# Patient Record
Sex: Male | Born: 1990
Health system: Southern US, Community
[De-identification: ages and names within clinical notes are randomized; demographics above are authoritative.]

## PROBLEM LIST (undated history)

## (undated) ENCOUNTER — Ambulatory Visit (HOSPITAL_COMMUNITY): Discharge status: Absent without leave | Payer: No Typology Code available for payment source

## (undated) DIAGNOSIS — F419 Anxiety disorder, unspecified: Secondary | ICD-10-CM

## (undated) HISTORY — PX: KNEE SURGERY: SHX244

## (undated) HISTORY — DX: Anxiety disorder, unspecified: F41.9

---

## 2013-08-13 ENCOUNTER — Ambulatory Visit (INDEPENDENT_AMBULATORY_CARE_PROVIDER_SITE_OTHER): Payer: BC Managed Care – PPO | Admitting: Physician Assistant

## 2013-08-13 VITALS — BP 136/80 | HR 81 | Temp 98.9°F | Ht 70.0 in | Wt 175.0 lb

## 2013-08-13 DIAGNOSIS — A749 Chlamydial infection, unspecified: Secondary | ICD-10-CM

## 2013-08-13 DIAGNOSIS — A7489 Other chlamydial diseases: Secondary | ICD-10-CM

## 2013-08-13 MED ORDER — AZITHROMYCIN 500 MG PO TABS
1000.0000 mg | ORAL_TABLET | Freq: Once | ORAL | Status: DC
Start: 2013-08-13 — End: 2019-02-05

## 2013-08-13 NOTE — Progress Notes (Signed)
   Subjective:    Patient ID: Miguel Lee, male    DOB: October 06, 1990, 23 y.o.   MRN: [id number]  HPI Pt's girlfriend had chlamydia and he had his testing done at [facility] and they called him and told him he also had chlamydia.  They also tested him for HIV, gonorrhea and syphilis.  He is not having any symptoms.    Review of Systems     Objective:   Physical Exam  Vitals reviewed. Constitutional: He is oriented to person, place, and time. He appears well-developed and well-nourished.  HENT:  Head: Normocephalic and atraumatic.  Right Ear: External ear normal.  Left Ear: External ear normal.  Eyes: Conjunctivae are normal.  Neck: Normal range of motion.  Pulmonary/Chest: Effort normal.  Neurological: He is alert and oriented to person, place, and time.  Skin: Skin is warm and dry.  Psychiatric: He has a normal mood and affect. His behavior is normal. Judgment and thought content normal.       Assessment & Plan:  Chlamydia - Plan: azithromycin (ZITHROMAX) 500 MG tablet  Recommended no sexual encounter for 7 days - and also encouraged test for cure in 7-10 days.  Benny LennertSarah Weber PA-C 08/13/2013 7:02 PM

## 2014-05-10 ENCOUNTER — Emergency Department (HOSPITAL_COMMUNITY)
Admission: EM | Admit: 2014-05-10 | Discharge: 2014-05-10 | Disposition: A | Discharge status: Home / Self Care | Payer: No Typology Code available for payment source | Attending: Emergency Medicine | Admitting: Emergency Medicine

## 2014-05-10 ENCOUNTER — Encounter (HOSPITAL_COMMUNITY): Payer: Self-pay | Admitting: Emergency Medicine

## 2014-05-10 DIAGNOSIS — Z87891 Personal history of nicotine dependence: Secondary | ICD-10-CM | POA: Diagnosis not present

## 2014-05-10 DIAGNOSIS — S3992XA Unspecified injury of lower back, initial encounter: Secondary | ICD-10-CM | POA: Diagnosis not present

## 2014-05-10 DIAGNOSIS — Y92410 Unspecified street and highway as the place of occurrence of the external cause: Secondary | ICD-10-CM | POA: Insufficient documentation

## 2014-05-10 DIAGNOSIS — Y9389 Activity, other specified: Secondary | ICD-10-CM | POA: Insufficient documentation

## 2014-05-10 DIAGNOSIS — G44219 Episodic tension-type headache, not intractable: Secondary | ICD-10-CM | POA: Insufficient documentation

## 2014-05-10 DIAGNOSIS — M545 Low back pain, unspecified: Secondary | ICD-10-CM

## 2014-05-10 DIAGNOSIS — S50812A Abrasion of left forearm, initial encounter: Secondary | ICD-10-CM | POA: Insufficient documentation

## 2014-05-10 DIAGNOSIS — S40811A Abrasion of right upper arm, initial encounter: Secondary | ICD-10-CM | POA: Insufficient documentation

## 2014-05-10 MED ORDER — METHOCARBAMOL 500 MG PO TABS: 500.0000 mg | ORAL_TABLET | Freq: Two times a day (BID) | ORAL | Status: DC

## 2014-05-10 MED ORDER — IBUPROFEN 400 MG PO TABS
800.0000 mg | ORAL_TABLET | Freq: Once | ORAL | Status: AC
  Administered 2014-05-10: 800 mg via ORAL
  Filled 2014-05-10: qty 2

## 2014-05-10 MED ORDER — NAPROXEN 500 MG PO TABS: 500.0000 mg | ORAL_TABLET | Freq: Two times a day (BID) | ORAL | Status: DC

## 2014-05-10 NOTE — ED Provider Notes (Signed)
Medical screening examination/treatment/procedure(s) were performed by non-physician practitioner and as supervising physician I was immediately available for consultation/collaboration.   EKG Interpretation None        Revis Whalin N Megan Hayduk, DO 05/10/14 [id number] 

## 2014-05-10 NOTE — ED Notes (Signed)
Declined W/C at D/C and was escorted to lobby by RN. 

## 2014-05-10 NOTE — Discharge Instructions (Signed)
1. Medications: robaxin, naproxyn, usual home medications °2. Treatment: rest, drink plenty of fluids, gentle stretching as discussed, alternate ice and heat °3. Follow Up: Please followup with your primary doctor in 3 days for discussion of your diagnoses and further evaluation after today's visit; if you do not have a primary care doctor use the resource guide provided to find one;  Return to the ER for worsening back pain, difficulty walking, loss of bowel or bladder control or other concerning symptoms ° ° °Back Exercises °Back exercises help treat and prevent back injuries. The goal of back exercises is to increase the strength of your abdominal and back muscles and the flexibility of your back. These exercises should be started when you no longer have back pain. Back exercises include: °· Pelvic Tilt. Lie on your back with your knees bent. Tilt your pelvis until the lower part of your back is against the floor. Hold this position 5 to 10 sec and repeat 5 to 10 times. °· Knee to Chest. Pull first 1 knee up against your chest and hold for 20 to 30 seconds, repeat this with the other knee, and then both knees. This may be done with the other leg straight or bent, whichever feels better. °· Sit-Ups or Curl-Ups. Bend your knees 90 degrees. Start with tilting your pelvis, and do a partial, slow sit-up, lifting your trunk only 30 to 45 degrees off the floor. Take at least 2 to 3 seconds for each sit-up. Do not do sit-ups with your knees out straight. If partial sit-ups are difficult, simply do the above but with only tightening your abdominal muscles and holding it as directed. °· Hip-Lift. Lie on your back with your knees flexed 90 degrees. Push down with your feet and shoulders as you raise your hips a couple inches off the floor; hold for 10 seconds, repeat 5 to 10 times. °· Back arches. Lie on your stomach, propping yourself up on bent elbows. Slowly press on your hands, causing an arch in your low back. Repeat 3  to 5 times. Any initial stiffness and discomfort should lessen with repetition over time. °· Shoulder-Lifts. Lie face down with arms beside your body. Keep hips and torso pressed to floor as you slowly lift your head and shoulders off the floor. °Do not overdo your exercises, especially in the beginning. Exercises may cause you some mild back discomfort which lasts for a few minutes; however, if the pain is more severe, or lasts for more than 15 minutes, do not continue exercises until you see your caregiver. Improvement with exercise therapy for back problems is slow.  °See your caregivers for assistance with developing a proper back exercise program. °Document Released: 08/04/2004 Document Revised: 09/19/2011 Document Reviewed: 04/28/2011 °ExitCare® Patient Information ©2015 [employer]. This information is not intended to replace advice given to you by your health care provider. Make sure you discuss any questions you have with your health care provider. ° ° ° °Emergency Department Resource Guide °1) Find a Doctor and Pay Out of Pocket °Although you won't have to find out who is covered by your insurance plan, it is a good idea to ask around and get recommendations. You will then need to call the office and see if the doctor you have chosen will accept you as a new patient and what types of options they offer for patients who are self-pay. Some doctors offer discounts or will set up payment plans for their patients who do not have insurance, but   you will need to ask so you aren't surprised when you get to your appointment. ° °2) Contact Your Local Health Department °Not all health departments have doctors that can see patients for sick visits, but many do, so it is worth a call to see if yours does. If you don't know where your local health department is, you can check in your phone book. The CDC also has a tool to help you locate your state's health department, and many state websites also have listings of all  of their local health departments. ° °3) Find a Walk-in Clinic °If your illness is not likely to be very severe or complicated, you may want to try a walk in clinic. These are popping up all over the country in pharmacies, drugstores, and shopping centers. They're usually staffed by nurse practitioners or physician assistants that have been trained to treat common illnesses and complaints. They're usually fairly quick and inexpensive. However, if you have serious medical issues or chronic medical problems, these are probably not your best option. ° °No Primary Care Doctor: °- Call [facility] at  832-8000 - they can help you locate a primary care doctor that  accepts your insurance, provides certain services, etc. °- Physician Referral Service- 1-800-533-3463 ° °Chronic Pain Problems: °Organization         Address  Phone   Notes  °[facility]  (336) 297-2271 Patients need to be referred by their primary care doctor.  ° °Medication Assistance: °Organization         Address  Phone   Notes  °[place] [facility] [address] °[place], [place] [address] (336) 641-8030 --Must be a resident of [place] °-- Must have NO insurance coverage whatsoever (no Medicaid/ Medicare, etc.) °-- The pt. MUST have a primary care doctor that directs their care regularly and follows them in the community °  °[facility]  (866) 331-1348   °[employer]  (888) 892-1162   ° °Agencies that provide inexpensive medical care: °Organization         Address  Phone   Notes  °[facility]  (336) 832-8035   °[facility]    (336) 832-7272   °[facility] [address] °[place], [place] [address] (336) 832-4777   °[facility] [address], °[place] (336) 271-4999   °[facility]    (336) 373-0678   °[facility]    (336) 272-1050   °[facility] ° [address],  [place] Phone:  (336) 832-4444, Fax:  (336) 832-4440 Hours of Operation:  9 am - 6 pm, M-F.  Also accepts Medicaid/Medicare and self-pay.  °[facility] ° [address], [place] Phone: (336) 832-3150, Fax: (336) 832-3151. Hours of Operation:  8:30 am - 5:30 pm, M-F.  Also accepts Medicaid and self-pay.  °[facility] [place] [address], [place] Phone: (336) 878-6027   °[facility] [address], [place],  (336)723-1848, Ext. 123 Mondays & Thursdays: 7-9 AM.  First 15 patients are seen on a first come, first serve basis. °  ° °Medicaid-accepting [place] Providers: ° °Organization         Address  Phone   Notes  °[facility] [address], [place] (336) 641-2100 Also accepts self-pay patients.  °[facility] [address], [place] ° (336) 856-9996   °[facility] [address], [place] (336) 288-8857   °[facility] [address] [place] [address], [place] (336) 299-7000   °Veita Bland [address], [place]  ° (336) 373-1557 Only accepts [place] Access Medicaid patients after they have their name applied to their card.  ° °Self-Pay (no insurance) in [place]: ° °Organization         Address  Phone   Notes  °Sickle Cell Patients, [facility] [address], [place] (336) 832-1970   °[facility] [address], [place] (336) 832-4400   °[facility] [employer] ° [address], [place] (336) 992-4800   °[facility]/Dr. Osei-Bonsu ° [address] [place] [address], [place] or [address], [place] (336) 841-8500 Phone number for both [place] and [place] locations is the same.  °[facility] [address], [place] (336) 299-0000   °[facility] [address] [place] [address], [place] or [address] (336) 852-7530 °(336) 878-2260   °[facility] [address], [place] (336) 350-1642, phone; (336) 294-5005, fax Sees patients 1st and 3rd Saturday of every month.  Must not qualify for public or private insurance (i.e. Medicaid, Medicare, [place] Health Choice, Veterans' Benefits) • Household income should be no more than 200% of the poverty level •The clinic cannot treat you if you are pregnant or think you are pregnant • Sexually transmitted diseases are not treated at the clinic.  ° ° °Dental Care: °Organization         Address  Phone  Notes  °[facility] [address], [place] (336) 641-6152 Accepts children up to age 21 who are enrolled in Medicaid or [place] Health Choice; pregnant women with a Medicaid card; and children who have applied for Medicaid or [place] Health Choice, but were declined, whose parents can pay a reduced fee at time of service.  °[facility] [place]  [address], [place] (336) 641-7733 Accepts children up to age 21 who are enrolled in Medicaid or [place] Health Choice; pregnant women with a Medicaid card; and children who have applied for Medicaid or [place] Health Choice, but were declined, whose parents can pay a reduced fee at time of service.  °[facility] ° [address], [place] (336) 641-4533 Patients are seen by appointment only. Walk-ins are not accepted. [facility] will see patients 18 years of age and older. °Monday - Tuesday (8am-5pm) °Most Wednesdays (8:30-5pm) °$30 per visit, cash only  °[facility] ° [address], [place] (336) 641-4533 Patients are seen by appointment only. Walk-ins are not accepted. [facility] will see patients 18 years of age and older. °One Wednesday Evening (Monthly: Volunteer Based).  $30 per visit, cash only  °[facility]   (919) 537-3737 for adults; Children under age 4, call Graduate Pediatric Dentistry at ([phone]) [phone]. Children aged 4-14, please call (919) [phone] to request a pediatric application. ° Dental services are provided in all areas of dental care including fillings, crowns and bridges, complete and partial dentures, implants, gum treatment, root canals, and extractions. Preventive care is also provided. Treatment is provided to both adults and children. °Patients are selected via a lottery   and there is often a waiting list. °  °[facility] [address], °[place] ° (336) 763-8833 [email or web address] °  °[facility] [address], [place], [place] (336)723-1848, Ext. 123 Second and Fourth Thursday of each month, opens at 6:30 AM; Clinic ends at 9 AM.  Patients are seen on a first-come first-served basis, and a limited number are seen during each clinic.  ° °[facility] ° [address], [place], [place] (336) 723-7904   Eligibility Requirements °You must have lived in [place], [place], or [place] counties for at least the last three months. °  You cannot be eligible for state or federal sponsored healthcare insurance, including Veterans Administration, Medicaid, or Medicare. °  You generally cannot be eligible for healthcare insurance through your employer.  °  How to apply: °Eligibility screenings are held every Tuesday and Wednesday afternoon from 1:00 pm until 4:00 pm. You do not need an appointment for the interview!  °[facility] [address], [place], [place] 336-631-2330   °[facility]  336-342-8273   °[facility]  336-703-3100   °[facility]  336-570-6415   ° °Behavioral Health Resources in the Community: °Intensive Outpatient Programs °Organization         Address  Phone  Notes  °[facility] [address], [place], [place] 336-878-6098   °[facility] [address], [place], [place] 336-832-9800   °[facility] [address], [place], [place] ° 336-882-2125   °[facility] [address],  °[place], [place] 1-800-853-5163 or 336-641-4981   °Substance Abuse Resources °Organization         Address  Phone  Notes  °[facility]  336-882-2125   °[facility]  336-784-9470   °[facility]  336-285-9073   °[facility]  336-845-3988   °Residential & Outpatient Substance Abuse Program  1-800-659-3381   °Psychological Services °Organization         Address  Phone  Notes  °[facility]  336- 832-9600   °[facility]  336- 378-7881   °[facility] [address], [place] 1-800-853-5163 or 336-641-4981   ° °Mobile Crisis Teams °Organization         Address  Phone  Notes  °[facility], Mobile Crisis Care Unit  1-877-626-1772   °Assertive °[facility] ° [address] [place], [place] 336-834-9664   °Sharon DeEsch [address] °[place] 336-554-5454   ° °Self-Help/Support Groups °Organization         Address  Phone             Notes  °[facility] [place] - variety of support groups  336- 373-1402 Call for more information  °Narcotics Anonymous (NA), [facility] [address], °[place]  2 meetings at this location  ° °Residential Treatment Programs °Organization         Address  Phone  Notes  °[facility] [address],    °[place]  1-866-801-8205   °[facility] ° [address], [place], [place] 704-293-8524   °[facility] [address], [place] 336-845-3988 Admissions: 8am-3pm M-F  °[facility] [address]    °[place], [place] 336-841-1104   °[facility] [address], , [place]   336-379-7146   °[facility] [address]  °[place], [place] 336-285-9073     °[facility] - Intensive Outpatient [address], [place], [place] 336-852-3033   °[facility] [address]  °[place], [place] 1-877-615-2722 or 336-784-9470   °[facility] [address] [place], [place] 336-227-7417 Accepts Medicaid  °[facility] [address]  °[place] 1-800-659-3381 Substance Abuse/Addiction Treatment  ° °[place] Behavioral Health Resources °Organization         Address  Phone  Notes  °[facility]  (888) 581-9988   °Julie Brannon, PhD [address] [place], [place]   (336) 349-5553 or (336) 951-0000   °[facility]   [address] °[place], [place] (336) 349-4454   °[facility] [address], [place], [place] (336) 342-8316 Insurance/Medicaid/sponsorship through [facility]  °[facility] [address]                                    [place], [place] (336) 342-8316 Therapy/tele-psych/case  °[facility] [address]  ° [place], [place] (336) 349-2233    °Dr. Arfeen  (336) 349-4544   °[facility] [place]  [employer] [place] [facility] 1) [address], [place] °2) [address], [place] °3)  [address], [place] (336) 349-3220 °(336) 342-7768 ° °(336) 342-8140   °[place] [facility] (336) 342-1394 or (336) 342-3537 (After Hours)    ° ° ° ° ° °
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
## 2016-05-17 ENCOUNTER — Ambulatory Visit (HOSPITAL_COMMUNITY)
Admission: EM | Admit: 2016-05-17 | Discharge: 2016-05-17 | Disposition: A | Discharge status: Home / Self Care | Payer: Self-pay | Attending: Family Medicine | Admitting: Family Medicine

## 2016-05-17 DIAGNOSIS — K047 Periapical abscess without sinus: Secondary | ICD-10-CM

## 2016-05-17 MED ORDER — DICLOFENAC POTASSIUM 50 MG PO TABS: 50.0000 mg | ORAL_TABLET | Freq: Three times a day (TID) | ORAL | 1 refills | Status: DC

## 2016-05-17 MED ORDER — CLINDAMYCIN HCL 300 MG PO CAPS: 300.0000 mg | ORAL_CAPSULE | Freq: Three times a day (TID) | ORAL | 0 refills | Status: DC

## 2016-05-17 NOTE — ED Triage Notes (Signed)
C/o dental pain States two days ago tooth has been hurting  Did break tooth initially two years ago

## 2016-05-17 NOTE — Discharge Instructions (Signed)
Take medicine as prescribed, see your dentist as soon as possible °

## 2016-05-17 NOTE — ED Provider Notes (Signed)
[facility]    CSN: [id number] Arrival date & time: 05/17/16  1005     History   Chief Complaint Chief Complaint  Patient presents with  . Dental Pain    right bottom tooth     HPI Miguel Lee is a 25 y.o. male.   The history is provided by the patient.  Dental Pain  Location:  Lower Lower teeth location:  31/RL 2nd molar Quality:  Throbbing Severity:  Moderate Onset quality:  Sudden Duration:  2 days Progression:  Worsening Chronicity:  New Context: dental fracture   Relieved by:  Topical anesthetic gel Worsened by:  Nothing Ineffective treatments:  None tried Associated symptoms: facial swelling and gum swelling   Risk factors: lack of dental care     No past medical history on file.  There are no active problems to display for this patient.   Past Surgical History:  Procedure Laterality Date  . KNEE SURGERY         Home Medications    Prior to Admission medications   Medication Sig Start Date End Date Taking? Authorizing Provider  azithromycin (ZITHROMAX) 500 MG tablet Take 2 tablets (1,000 mg total) by mouth once. 08/13/13   Morrell RiddleSarah L Weber, PA-C  methocarbamol (ROBAXIN) 500 MG tablet Take 1 tablet (500 mg total) by mouth 2 (two) times daily. 05/10/14   Hannah Muthersbaugh, PA-C  naproxen (NAPROSYN) 500 MG tablet Take 1 tablet (500 mg total) by mouth 2 (two) times daily with a meal. 05/10/14   Dahlia ClientHannah Muthersbaugh, PA-C    Family History No family history on file.  Social History Social History  Substance Use Topics  . Smoking status: Former Games developermoker  . Smokeless tobacco: Never Used  . Alcohol use Yes     Allergies   Patient has no known allergies.   Review of Systems Review of Systems  Constitutional: Negative.   HENT: Positive for dental problem and facial swelling.   All other systems reviewed and are negative.    Physical Exam Triage Vital Signs ED Triage Vitals [05/17/16 1030]  Enc Vitals Group     BP 135/87   Pulse Rate 80     Resp 16     Temp 98.2 F (36.8 C)     Temp Source Oral     SpO2 98 %     Weight      Height      Head Circumference      Peak Flow      Pain Score      Pain Loc      Pain Edu?      Excl. in GC?    No data found.   Updated Vital Signs BP 135/87 (BP Location: Left Arm)   Pulse 80   Temp 98.2 F (36.8 C) (Oral)   Resp 16   SpO2 98%   Visual Acuity Right Eye Distance:   Left Eye Distance:   Bilateral Distance:    Right Eye Near:   Left Eye Near:    Bilateral Near:     Physical Exam  Constitutional: He appears well-developed and well-nourished.  HENT:  Head: Normocephalic.  Mouth/Throat: Mucous membranes are normal. Abnormal dentition. Dental abscesses and dental caries present.       UC Treatments / Results  Labs (all labs ordered are listed, but only abnormal results are displayed) Labs Reviewed - No data to display  EKG  EKG Interpretation None       Radiology No results  found.  Procedures Procedures (including critical care time)  Medications Ordered in UC Medications - No data to display   Initial Impression / Assessment and Plan / UC Course  I have reviewed the triage vital signs and the nursing notes.  Pertinent labs & imaging results that were available during my care of the patient were reviewed by me and considered in my medical decision making (see chart for details).  Clinical Course       Final Clinical Impressions(s) / UC Diagnoses   Final diagnoses:  None    New Prescriptions New Prescriptions   No medications on file     Linna HoffJames D Cameo Shewell, MD 05/17/16 1049

## 2016-05-18 MED FILL — CLINDAMYCIN HCL 150 MG CAP: 150 | 10 days supply | Qty: 30 | Fill #0

## 2016-09-05 MED FILL — AMOXICILLIN 500 MG CAPSULE: 500 | 7 days supply | Qty: 30 | Fill #0

## 2016-09-05 MED FILL — HYDROCODON-APAP 5-325: 5-325 | 2 days supply | Qty: 6 | Fill #0

## 2017-10-16 ENCOUNTER — Other Ambulatory Visit: Payer: Self-pay | Admitting: Nurse Practitioner

## 2017-10-16 ENCOUNTER — Ambulatory Visit
Admission: RE | Admit: 2017-10-16 | Discharge: 2017-10-16 | Disposition: A | Discharge status: Home / Self Care | Payer: Managed Care, Other (non HMO) | Source: Ambulatory Visit | Attending: Nurse Practitioner | Admitting: Nurse Practitioner

## 2017-10-16 DIAGNOSIS — M545 Low back pain: Secondary | ICD-10-CM

## 2018-01-04 ENCOUNTER — Other Ambulatory Visit: Payer: Self-pay | Admitting: Neurosurgery

## 2018-01-04 DIAGNOSIS — M4317 Spondylolisthesis, lumbosacral region: Secondary | ICD-10-CM

## 2018-01-20 ENCOUNTER — Ambulatory Visit
Admission: RE | Admit: 2018-01-20 | Discharge: 2018-01-20 | Disposition: A | Discharge status: Home / Self Care | Payer: Managed Care, Other (non HMO) | Source: Ambulatory Visit | Attending: Neurosurgery | Admitting: Neurosurgery

## 2018-01-20 DIAGNOSIS — M4317 Spondylolisthesis, lumbosacral region: Secondary | ICD-10-CM

## 2018-12-24 ENCOUNTER — Ambulatory Visit: Payer: Self-pay | Admitting: Nurse Practitioner

## 2019-02-05 ENCOUNTER — Other Ambulatory Visit: Payer: Self-pay

## 2019-02-05 ENCOUNTER — Ambulatory Visit (INDEPENDENT_AMBULATORY_CARE_PROVIDER_SITE_OTHER): Payer: Managed Care, Other (non HMO) | Admitting: Nurse Practitioner

## 2019-02-05 ENCOUNTER — Encounter: Payer: Self-pay | Admitting: Nurse Practitioner

## 2019-02-05 VITALS — BP 120/72 | HR 102 | Temp 98.4°F | Ht 68.2 in | Wt 248.4 lb

## 2019-02-05 DIAGNOSIS — R000 Tachycardia, unspecified: Secondary | ICD-10-CM

## 2019-02-05 DIAGNOSIS — F329 Major depressive disorder, single episode, unspecified: Secondary | ICD-10-CM | POA: Diagnosis not present

## 2019-02-05 DIAGNOSIS — Z1211 Encounter for screening for malignant neoplasm of colon: Secondary | ICD-10-CM

## 2019-02-05 DIAGNOSIS — G8929 Other chronic pain: Secondary | ICD-10-CM | POA: Diagnosis not present

## 2019-02-05 DIAGNOSIS — F419 Anxiety disorder, unspecified: Secondary | ICD-10-CM | POA: Diagnosis not present

## 2019-02-05 DIAGNOSIS — M5442 Lumbago with sciatica, left side: Secondary | ICD-10-CM

## 2019-02-05 DIAGNOSIS — Z0000 Encounter for general adult medical examination without abnormal findings: Secondary | ICD-10-CM | POA: Diagnosis not present

## 2019-02-05 DIAGNOSIS — F32A Depression, unspecified: Secondary | ICD-10-CM | POA: Insufficient documentation

## 2019-02-05 LAB — POCT URINALYSIS DIPSTICK
Bilirubin, UA: NEGATIVE
Blood, UA: NEGATIVE
Glucose, UA: NEGATIVE
Ketones, UA: NEGATIVE
Nitrite, UA: NEGATIVE
Protein, UA: NEGATIVE
Spec Grav, UA: 1.025 (ref 1.010–1.025)
Urobilinogen, UA: 2 E.U./dL — AB
pH, UA: 7.5 (ref 5.0–8.0)

## 2019-02-05 MED ORDER — TRIAMCINOLONE ACETONIDE 40 MG/ML IJ SUSP
40.0000 mg | Freq: Once | INTRAMUSCULAR | Status: AC
  Administered 2019-02-05: 40 mg via INTRAMUSCULAR

## 2019-02-05 NOTE — Progress Notes (Signed)
Subjective:     Patient ID: Miguel Lee , male    DOB: 13-Nov-1990 , 28 y.o.   MRN: [id number]   Chief Complaint  Patient presents with  . Annual Exam   Men's preventive visit. Patient Health Questionnaire (PHQ-2) is    Office Visit from 02/05/2019 in [facility]  PHQ-2 Total Score  0     Patient is on a  diet. Marital status: Single. Relevant history for alcohol use is:  Social History   Substance and Sexual Activity  Alcohol Use Yes   Relevant history for tobacco use is:  Social History   Tobacco Use  Smoking Status Never Smoker  Smokeless Tobacco Never Used   HPI  Here for HM  [facility] - currently taking paxil.  Wt Readings from Last 3 Encounters: 02/05/19 : 248 lb 6.4 oz (112.7 kg) 05/10/14 : 187 lb (84.8 kg) 08/13/13 : 175 lb (79.4 kg)  Works with shipping.   Depression        This is a chronic problem.  The current episode started 1 to 4 weeks ago.   The problem has been gradually improving since onset.  Associated symptoms include no decreased concentration.  Past treatments include SSRIs - Selective serotonin reuptake inhibitors.  Compliance with treatment is good.  Past medical history includes anxiety.   Anxiety Presents for follow-up visit. Patient reports no chest pain, decreased concentration or palpitations.       Past Medical History:  Diagnosis Date  . Anxiety      No family history on file.   Current Outpatient Medications:  .  PARoxetine (PAXIL) 30 MG tablet, Take 30 mg by mouth daily., Disp: , Rfl:    No Known Allergies   Review of Systems  Constitutional: Negative.   HENT: Negative.   Eyes: Negative.   Respiratory: Negative.  Negative for cough.   Cardiovascular: Negative for chest pain, palpitations and leg swelling.  Gastrointestinal: Negative.   Endocrine: Negative.  Negative for polydipsia, polyphagia and polyuria.  Genitourinary: Negative.   Musculoskeletal: Positive for back pain.  Negative for gait problem and joint swelling.  Skin: Negative.   Allergic/Immunologic: Negative.   Neurological: Negative.   Hematological: Negative.   Psychiatric/Behavioral: Positive for depression. Negative for decreased concentration.     Today's Vitals   02/05/19 1536  BP: 120/72  Pulse: (!) 102  Temp: 98.4 F (36.9 C)  TempSrc: Oral  Weight: 248 lb 6.4 oz (112.7 kg)  Height: 5' 8.2" (1.732 m)  PainSc: 2   PainLoc: Back   Body mass index is 37.55 kg/m.   Objective:  Physical Exam Vitals signs reviewed.  Constitutional:      Appearance: Normal appearance. He is obese.  HENT:     Head: Normocephalic and atraumatic.     Right Ear: Tympanic membrane, ear canal and external ear normal. There is no impacted cerumen.     Left Ear: Tympanic membrane, ear canal and external ear normal. There is no impacted cerumen.  Neck:     Musculoskeletal: Normal range of motion and neck supple.  Cardiovascular:     Rate and Rhythm: Normal rate and regular rhythm.     Pulses: Normal pulses.     Heart sounds: Normal heart sounds. No murmur.  Pulmonary:     Effort: Pulmonary effort is normal. No respiratory distress.     Breath sounds: Normal breath sounds.  Abdominal:     General: Abdomen is flat. Bowel sounds are normal.  There is no distension.     Palpations: Abdomen is soft.  Genitourinary:    Prostate: Normal.     Rectum: Guaiac result negative.  Musculoskeletal: Normal range of motion.  Skin:    General: Skin is warm and dry.     Capillary Refill: Capillary refill takes less than 2 seconds.  Neurological:     General: No focal deficit present.     Mental Status: He is alert and oriented to person, place, and time.  Psychiatric:        Mood and Affect: Mood normal.        Behavior: Behavior normal.        Thought Content: Thought content normal.        Judgment: Judgment normal.         Assessment And Plan:     1. Encounter for general adult medical examination w/o  abnormal findings . Behavior modifications discussed and diet history reviewed.   . Pt will continue to exercise regularly and modify diet with low GI, plant based foods and decrease intake of processed foods.  . Recommend intake of daily multivitamin, Vitamin D, and calcium.  . Recommend for preventive screenings, as well as recommend immunizations that include TDAP . Discussed focusing on healthy diet and weight loss - POCT Urinalysis Dipstick (81002)   2. Chronic bilateral low back pain with left-sided sciatica  Will treat with kenalog  He has been to see Dr. Franky Machoabbell however unable to go back at this time, was told he has stenosis  Pain creams have been ineffective - triamcinolone acetonide (KENALOG-40) injection 40 mg  3. Anxiety  Being followed by Behavioral health every 3 months.  His psychiatrist had placed him on an antihypertensive a few months ago for elevated blood pressure however this resolved on its own and his blood pressure is normal  4. Depression, unspecified depression type  Being followed by Behavioral health every 3 months  5. Tachycardia  Heart rate is slightly elevated this visit, EKG revealed NSR - EKG 12-Lead   Arnette FeltsJanece Green Quin[place], FNP    THE PATIENT IS ENCOURAGED TO PRACTICE SOCIAL DISTANCING DUE TO THE COVID-19 PANDEMIC.

## 2019-02-05 NOTE — Patient Instructions (Addendum)
Health Maintenance  Topic Date Due  . HIV Screening  11/30/2004  . TETANUS/TDAP  11/30/2008  . INFLUENZA VACCINE  02/09/2019   Health Maintenance, Male Adopting a healthy lifestyle and getting preventive care are important in promoting health and wellness. Ask your health care provider about:  The right schedule for you to have regular tests and exams.  Things you can do on your own to prevent diseases and keep yourself healthy. What should I know about diet, weight, and exercise? Eat a healthy diet   Eat a diet that includes plenty of vegetables, fruits, low-fat dairy products, and lean protein.  Do not eat a lot of foods that are high in solid fats, added sugars, or sodium. Maintain a healthy weight Body mass index (BMI) is a measurement that can be used to identify possible weight problems. It estimates body fat based on height and weight. Your health care provider can help determine your BMI and help you achieve or maintain a healthy weight. Get regular exercise Get regular exercise. This is one of the most important things you can do for your health. Most adults should:  Exercise for at least 150 minutes each week. The exercise should increase your heart rate and make you sweat (moderate-intensity exercise).  Do strengthening exercises at least twice a week. This is in addition to the moderate-intensity exercise.  Spend less time sitting. Even light physical activity can be beneficial. Watch cholesterol and blood lipids Have your blood tested for lipids and cholesterol at 29 years of age, then have this test every 5 years. You may need to have your cholesterol levels checked more often if:  Your lipid or cholesterol levels are high.  You are older than 10840 years of age.  You are at high risk for heart disease. What should I know about cancer screening? Many types of cancers can be detected early and may often be prevented. Depending on your health history and family  history, you may need to have cancer screening at various ages. This may include screening for:  Colorectal cancer.  Prostate cancer.  Skin cancer.  Lung cancer. What should I know about heart disease, diabetes, and high blood pressure? Blood pressure and heart disease  High blood pressure causes heart disease and increases the risk of stroke. This is more likely to develop in people who have high blood pressure readings, are of African descent, or are overweight.  Talk with your health care provider about your target blood pressure readings.  Have your blood pressure checked: ? Every 3-5 years if you are 6618-739 years of age. ? Every year if you are 852 years old or older.  If you are between the ages of 3465 and 2575 and are a current or former smoker, ask your health care provider if you should have a one-time screening for abdominal aortic aneurysm (AAA). Diabetes Have regular diabetes screenings. This checks your fasting blood sugar level. Have the screening done:  Once every three years after age 29 if you are at a normal weight and have a low risk for diabetes.  More often and at a younger age if you are overweight or have a high risk for diabetes. What should I know about preventing infection? Hepatitis B If you have a higher risk for hepatitis B, you should be screened for this virus. Talk with your health care provider to find out if you are at risk for hepatitis B infection. Hepatitis C Blood testing is recommended for:  Everyone  born from [birth year] through 1966.  Anyone with known risk factors for hepatitis C. Sexually transmitted infections (STIs)  You should be screened each year for STIs, including gonorrhea and chlamydia, if: ? You are sexually active and are younger than 29 years of age. ? You are older than 29 years of age and your health care provider tells you that you are at risk for this type of infection. ? Your sexual activity has changed since you were last  screened, and you are at increased risk for chlamydia or gonorrhea. Ask your health care provider if you are at risk.  Ask your health care provider about whether you are at high risk for HIV. Your health care provider may recommend a prescription medicine to help prevent HIV infection. If you choose to take medicine to prevent HIV, you should first get tested for HIV. You should then be tested every 3 months for as long as you are taking the medicine. Follow these instructions at home: Lifestyle  Do not use any products that contain nicotine or tobacco, such as cigarettes, e-cigarettes, and chewing tobacco. If you need help quitting, ask your health care provider.  Do not use street drugs.  Do not share needles.  Ask your health care provider for help if you need support or information about quitting drugs. Alcohol use  Do not drink alcohol if your health care provider tells you not to drink.  If you drink alcohol: ? Limit how much you have to 0-2 drinks a day. ? Be aware of how much alcohol is in your drink. In the [place] one drink equals one 12 oz bottle of beer (355 mL), one 5 oz glass of wine (148 mL), or one 1 oz glass of hard liquor (44 mL). General instructions  Schedule regular health, dental, and eye exams.  Stay current with your vaccines.  Tell your health care provider if: ? You often feel depressed. ? You have ever been abused or do not feel safe at home. Summary  Adopting a healthy lifestyle and getting preventive care are important in promoting health and wellness.  Follow your health care provider's instructions about healthy diet, exercising, and getting tested or screened for diseases.  Follow your health care provider's instructions on monitoring your cholesterol and blood pressure. This information is not intended to replace advice given to you by your health care provider. Make sure you discuss any questions you have with your health care provider. Document  Released: 12/24/2007 Document Revised: 06/20/2018 Document Reviewed: 06/20/2018 Elsevier Patient Education  2020 [employer].

## 2019-02-06 LAB — BMP8+ANION GAP
Anion Gap: 15 mmol/L (ref 10.0–18.0)
BUN/Creatinine Ratio: 14 (ref 9–20)
BUN: 13 mg/dL (ref 6–20)
CO2: 22 mmol/L (ref 20–29)
Calcium: 9 mg/dL (ref 8.7–10.2)
Chloride: 106 mmol/L (ref 96–106)
Creatinine, Ser: 0.94 mg/dL (ref 0.76–1.27)
GFR calc Af Amer: 126 mL/min/{1.73_m2} (ref >59)
GFR calc non Af Amer: 109 mL/min/{1.73_m2} (ref >59)
Glucose: 99 mg/dL (ref 65–99)
Potassium: 4 mmol/L (ref 3.5–5.2)
Sodium: 143 mmol/L (ref 134–144)

## 2019-02-06 LAB — LIPID PANEL
Chol/HDL Ratio: 5.2 ratio — ABNORMAL HIGH (ref 0.0–5.0)
Cholesterol, Total: 227 mg/dL — ABNORMAL HIGH (ref 100–199)
HDL: 44 mg/dL (ref >39)
LDL Calculated: 120 mg/dL — ABNORMAL HIGH (ref 0–99)
Triglycerides: 313 mg/dL — ABNORMAL HIGH (ref 0–149)
VLDL Cholesterol Cal: 63 mg/dL — ABNORMAL HIGH (ref 5–40)

## 2019-02-06 LAB — CBC
Hematocrit: 46.2 % (ref 37.5–51.0)
Hemoglobin: 16.1 g/dL (ref 13.0–17.7)
MCH: 30.5 pg (ref 26.6–33.0)
MCHC: 34.8 g/dL (ref 31.5–35.7)
MCV: 88 fL (ref 79–97)
Platelets: 266 10*3/uL (ref 150–450)
RBC: 5.28 x10E6/uL (ref 4.14–5.80)
RDW: 12.9 % (ref 11.6–15.4)
WBC: 8.4 10*3/uL (ref 3.4–10.8)

## 2019-02-06 LAB — VITAMIN D 25 HYDROXY (VIT D DEFICIENCY, FRACTURES): Vit D, 25-Hydroxy: 16 ng/mL — ABNORMAL LOW (ref 30.0–100.0)

## 2019-02-06 LAB — HEMOGLOBIN A1C
Est. average glucose Bld gHb Est-mCnc: 105 mg/dL
Hgb A1c MFr Bld: 5.3 % (ref 4.8–5.6)

## 2019-05-09 ENCOUNTER — Encounter: Payer: Self-pay | Admitting: Nurse Practitioner

## 2019-05-09 ENCOUNTER — Ambulatory Visit (INDEPENDENT_AMBULATORY_CARE_PROVIDER_SITE_OTHER): Payer: Managed Care, Other (non HMO) | Admitting: Nurse Practitioner

## 2019-05-09 ENCOUNTER — Other Ambulatory Visit: Payer: Self-pay

## 2019-05-09 VITALS — BP 120/74 | HR 79 | Temp 98.2°F | Ht 70.0 in | Wt 230.0 lb

## 2019-05-09 DIAGNOSIS — G8929 Other chronic pain: Secondary | ICD-10-CM | POA: Diagnosis not present

## 2019-05-09 DIAGNOSIS — M545 Low back pain: Secondary | ICD-10-CM

## 2019-05-09 DIAGNOSIS — E559 Vitamin D deficiency, unspecified: Secondary | ICD-10-CM

## 2019-05-09 NOTE — Progress Notes (Signed)
  Subjective:     Patient ID: Miguel Lee , male    DOB: 10/15/1990 , 28 y.o.   MRN: [id number]   Chief Complaint  Patient presents with  . vitamin d recheck    patient presents today for a vitamin d recheck. patient stated he has not taken his vitamin d    HPI  He is here today for follow up of his vitamin D.  He did not go and pick up the medication.      Past Medical History:  Diagnosis Date  . Anxiety      No family history on file.   Current Outpatient Medications:  .  PARoxetine (PAXIL) 30 MG tablet, Take 30 mg by mouth daily., Disp: , Rfl:    No Known Allergies   Review of Systems  Constitutional: Negative.   Respiratory: Negative.  Negative for cough.   Cardiovascular: Negative for chest pain, palpitations and leg swelling.  Gastrointestinal: Negative.   Endocrine: Negative.  Negative for polydipsia, polyphagia and polyuria.  Genitourinary: Negative.   Musculoskeletal: Positive for back pain (Chronic back pain). Negative for gait problem and joint swelling.  Neurological: Negative.   Hematological: Negative.   Psychiatric/Behavioral: Negative for decreased concentration.     Today's Vitals   05/09/19 1400  BP: 120/74  Pulse: 79  Temp: 98.2 F (36.8 C)  TempSrc: Oral  Weight: 230 lb (104.3 kg)  Height: 5\' 10"  (1.778 m)  PainSc: 4   PainLoc: Back   Body mass index is 33 kg/m.   Objective:  Physical Exam Constitutional:      Appearance: Normal appearance.  Cardiovascular:     Rate and Rhythm: Normal rate and regular rhythm.     Pulses: Normal pulses.     Heart sounds: Normal heart sounds. No murmur.  Pulmonary:     Effort: Pulmonary effort is normal. No respiratory distress.     Breath sounds: Normal breath sounds.  Musculoskeletal: Normal range of motion.        General: No tenderness.  Skin:    Capillary Refill: Capillary refill takes less than 2 seconds.  Neurological:     General: No focal deficit present.     Mental Status: He is  alert and oriented to person, place, and time.  Psychiatric:        Mood and Affect: Mood normal.        Behavior: Behavior normal.        Thought Content: Thought content normal.        Judgment: Judgment normal.         Assessment And Plan:   1. Vitamin D deficiency  I have discussed with him the benefits of vitamin d to include bone health as he has chronic back pain and reports a fracture to his sacral area, this may help.   I am not checking his vitamin d today due to not taking the medication he will return in 3 months  2. Chronic bilateral low back pain, unspecified whether sciatica present  He was seen by Dr. Christella Noa and was to have an epidural placed however he now has a balance and is unable to go back  Currently the pain is manageable.  Influenza immunization was not given due to patient refusal. Minette Brine, FNP    THE PATIENT IS ENCOURAGED TO PRACTICE SOCIAL DISTANCING DUE TO THE COVID-19 PANDEMIC.

## 2019-08-12 ENCOUNTER — Ambulatory Visit (INDEPENDENT_AMBULATORY_CARE_PROVIDER_SITE_OTHER): Payer: Managed Care, Other (non HMO) | Admitting: Nurse Practitioner

## 2019-08-12 ENCOUNTER — Other Ambulatory Visit: Payer: Self-pay

## 2019-08-12 ENCOUNTER — Encounter: Payer: Self-pay | Admitting: Nurse Practitioner

## 2019-08-12 VITALS — BP 114/80 | HR 92 | Temp 98.9°F | Ht 69.0 in | Wt 238.6 lb

## 2019-08-12 DIAGNOSIS — M545 Low back pain, unspecified: Secondary | ICD-10-CM

## 2019-08-12 DIAGNOSIS — E559 Vitamin D deficiency, unspecified: Secondary | ICD-10-CM

## 2019-08-12 DIAGNOSIS — Z113 Encounter for screening for infections with a predominantly sexual mode of transmission: Secondary | ICD-10-CM | POA: Diagnosis not present

## 2019-08-12 DIAGNOSIS — G8929 Other chronic pain: Secondary | ICD-10-CM

## 2019-08-12 MED ORDER — TRIAMCINOLONE ACETONIDE 40 MG/ML IJ SUSP
60.0000 mg | Freq: Once | INTRAMUSCULAR | Status: AC
  Administered 2019-08-12: 60 mg via INTRAMUSCULAR

## 2019-08-12 NOTE — Progress Notes (Signed)
Subjective:     Patient ID: Miguel Lee , male    DOB: [birth year] , 29 y.o.   MRN: [id number]   Chief Complaint  Patient presents with  . VITAMIN D    patient stated after taking the vitamin d for 3 weeks he noticed he was feeling more fatigued  . Back Pain    patient stated his siatica has been bothering him when he is standling or walking for a long period of time.    HPI  He is here today for follow up for low vitamin d when he took the higher dose he had fatigue.  Now that he is taking 3,000 mcg per week and does not feel as tired.   Back Pain This is a chronic problem. The current episode started 1 to 4 weeks ago. The problem occurs 2 to 4 times per day. The problem has been gradually worsening since onset. The pain is present in the sacro-iliac. The quality of the pain is described as shooting and stabbing. The pain does not radiate. Pertinent negatives include no abdominal pain, chest pain or headaches.     Past Medical History:  Diagnosis Date  . Anxiety      No family history on file.   Current Outpatient Medications:  Marland Kitchen  VITAMIN D PO, Take by mouth. Take one tablet weekly., Disp: , Rfl:  .  PARoxetine (PAXIL) 30 MG tablet, Take 30 mg by mouth daily., Disp: , Rfl:    No Known Allergies   Review of Systems  Constitutional: Negative.   Respiratory: Negative.  Negative for cough.   Cardiovascular: Negative for chest pain, palpitations and leg swelling.  Gastrointestinal: Negative.  Negative for abdominal pain.  Endocrine: Negative.  Negative for polydipsia, polyphagia and polyuria.  Genitourinary: Negative.   Musculoskeletal: Positive for back pain (low back pain). Negative for gait problem and joint swelling.  Neurological: Negative.  Negative for dizziness and headaches.  Hematological: Negative.   Psychiatric/Behavioral: Negative for decreased concentration.     Today's Vitals   08/12/19 1131  BP: 114/80  Pulse: 92  Temp: 98.9 F (37.2 C)  TempSrc: Oral   Weight: 238 lb 9.6 oz (108.2 kg)  Height: 5\' 9"  (1.753 m)  PainSc: 0-No pain   Body mass index is 35.24 kg/m.   Objective:  Physical Exam Constitutional:      Appearance: Normal appearance.  Cardiovascular:     Rate and Rhythm: Normal rate and regular rhythm.     Pulses: Normal pulses.     Heart sounds: Normal heart sounds. No murmur.  Pulmonary:     Effort: Pulmonary effort is normal. No respiratory distress.     Breath sounds: Normal breath sounds.  Musculoskeletal:        General: No tenderness. Normal range of motion.  Skin:    Capillary Refill: Capillary refill takes less than 2 seconds.  Neurological:     General: No focal deficit present.     Mental Status: He is alert and oriented to person, place, and time.  Psychiatric:        Mood and Affect: Mood normal.        Behavior: Behavior normal.        Thought Content: Thought content normal.        Judgment: Judgment normal.         Assessment And Plan:   1. Vitamin D deficiency  Will check vitamin D level and supplement as needed.     Also encouraged  to spend 15 minutes in the sun daily.  - VITAMIN D 25 Hydroxy (Vit-D Deficiency, Fractures)  2. Screening examination for STD (sexually transmitted disease)  - HIV Antibody (routine testing w rflx)  3. Chronic bilateral low back pain, unspecified whether sciatica present  Tenderness to low back, will treat with kenalog injection  Encouraged to stretch regularly  He tells me he is not interested in surgical intervention due to possibly not helping much - triamcinolone acetonide (KENALOG-40) injection 60 mg    Minette Brine, FNP    THE PATIENT IS ENCOURAGED TO PRACTICE SOCIAL DISTANCING DUE TO THE COVID-19 PANDEMIC.

## 2019-08-13 LAB — VITAMIN D 25 HYDROXY (VIT D DEFICIENCY, FRACTURES): Vit D, 25-Hydroxy: 15 ng/mL — ABNORMAL LOW (ref 30.0–100.0)

## 2019-08-13 LAB — HIV ANTIBODY (ROUTINE TESTING W REFLEX): HIV Screen 4th Generation wRfx: NONREACTIVE

## 2019-09-24 IMAGING — MR MR LUMBAR SPINE W/O CM
4 of 5 series · 27 of 48 positions shown · non-contrast
Comparison: Radiography 10/16/2017

CLINICAL DATA: Low back pain radiating to the left leg, 3 years
duration. Left leg numbness and weakness as well.

EXAM:
MRI LUMBAR SPINE WITHOUT CONTRAST
TECHNIQUE: Multiplanar, multisequence MR imaging of the lumbar spine was
performed. No intravenous contrast was administered.

[Series 3: T2 · sagittal · 4.0mm · 0.57mm/px · 4 of 14 slices shown (1 of 2)]
[im 1/14]
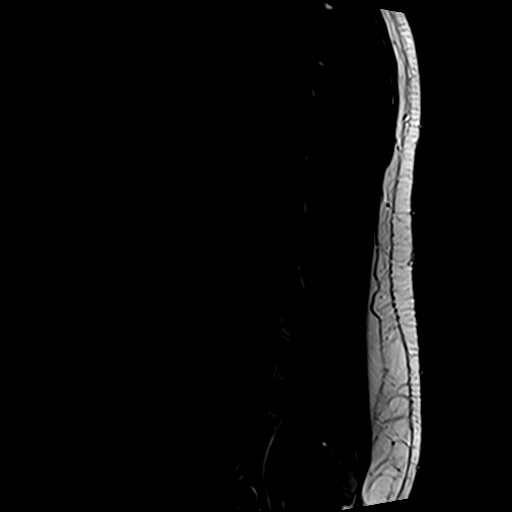
[im 5/14]
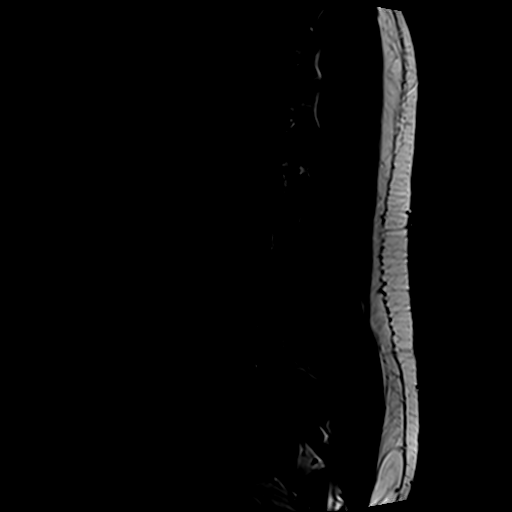
[im 9/14]
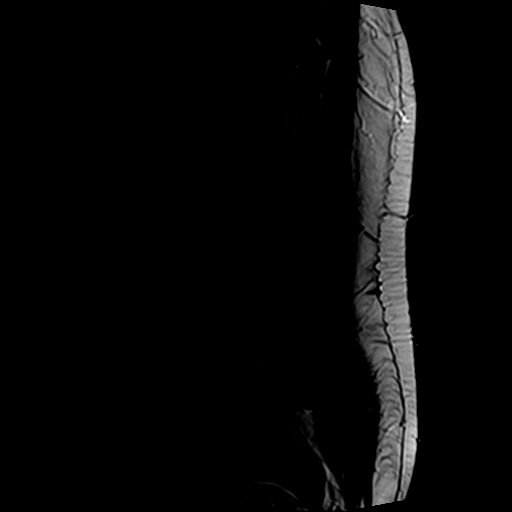
[im 14/14]
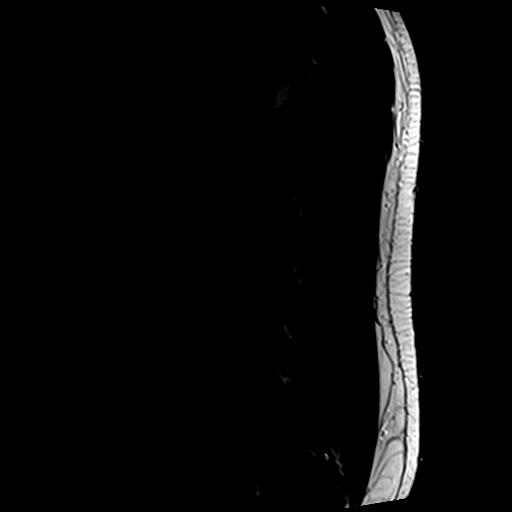

[Series 5: T1 · sagittal · 4.0mm · 0.57mm/px · 5 of 14 slices shown (1 of 2)]
[im 1/14]
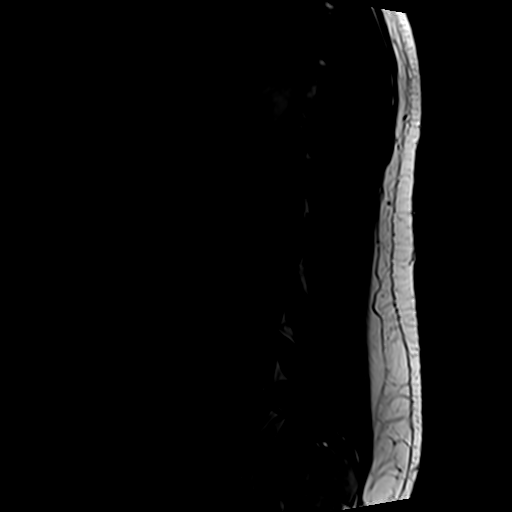
[im 4/14]
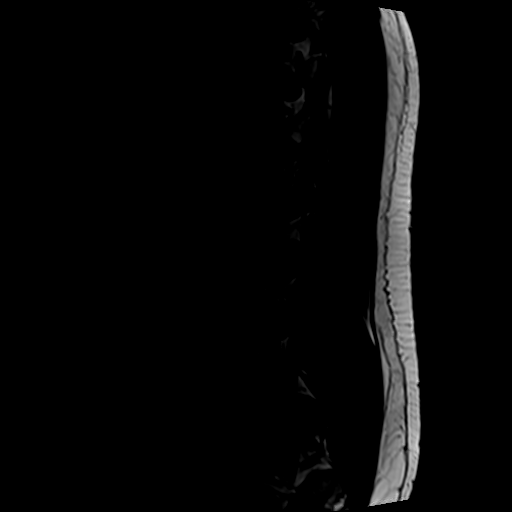
[im 7/14]
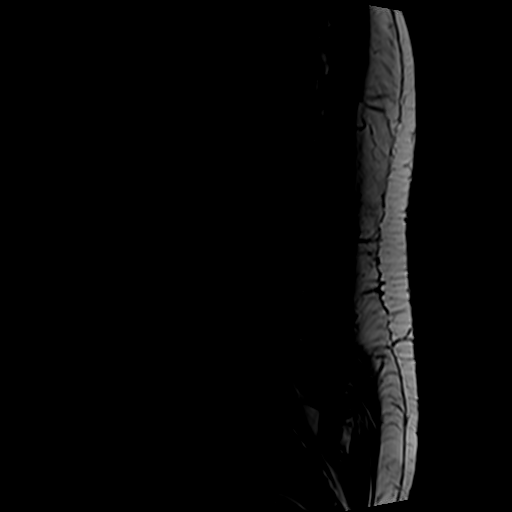
[im 10/14]
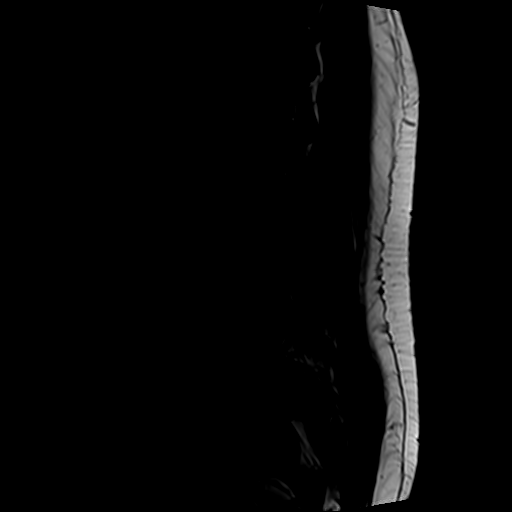
[im 14/14]
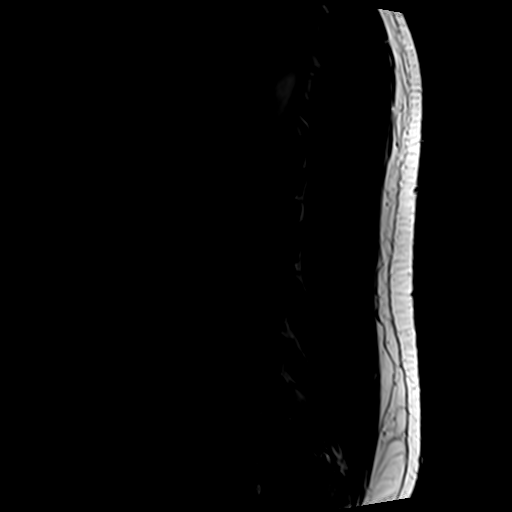

[Series 6: T2 · axial · 4.0mm · 0.70mm/px · z∈[-165,+61]mm · 11 of 48 slices shown (2 of 2)]
[im 3/48]
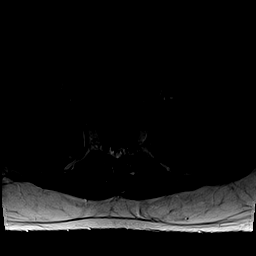
[im 6/48]
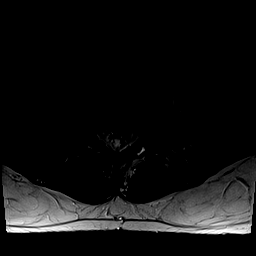
[im 9/48]
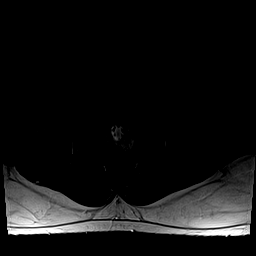
[im 15/48]
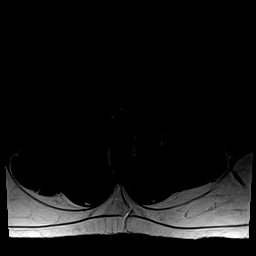
[im 21/48]
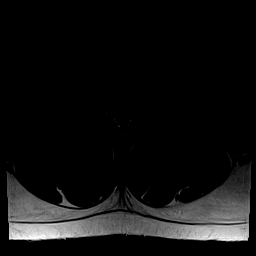
[im 24/48]
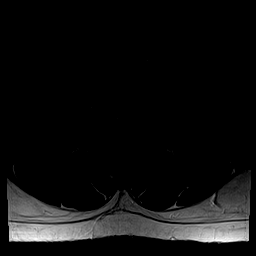
[im 27/48]
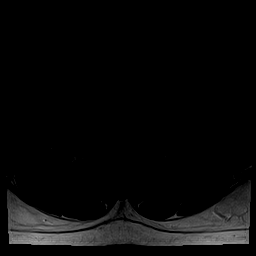
[im 33/48]
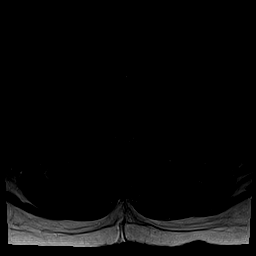
[im 39/48]
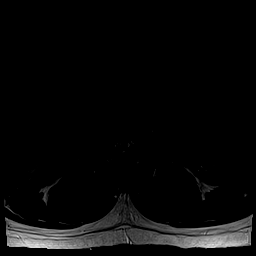
[im 42/48]
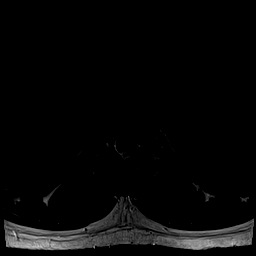
[im 45/48]
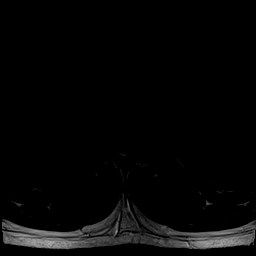

[Series 7: T1 · axial · 4.0mm · 0.35mm/px · z∈[-165,+45]mm · 7 of 48 slices shown (2 of 2)]
[im 3/48]
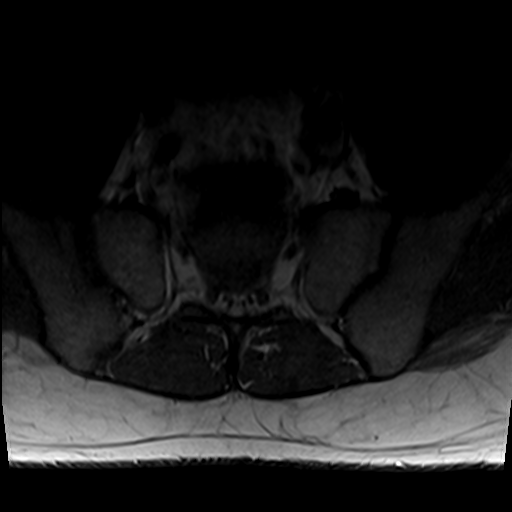
[im 6/48]
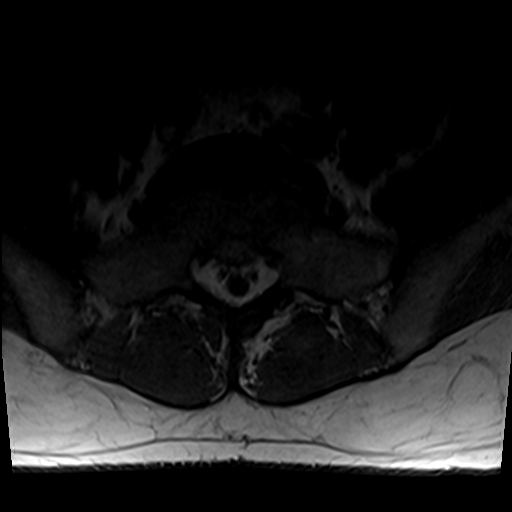
[im 9/48]
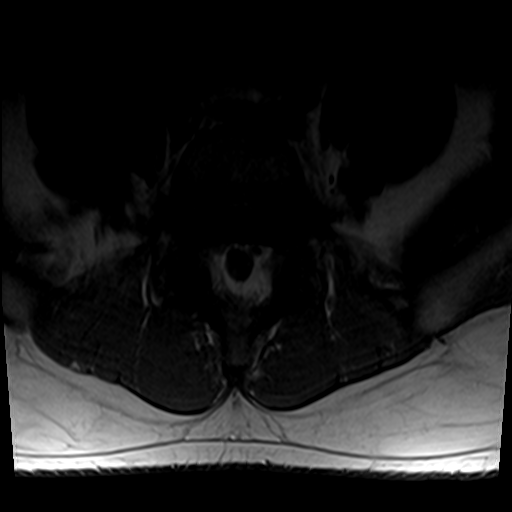
[im 15/48]
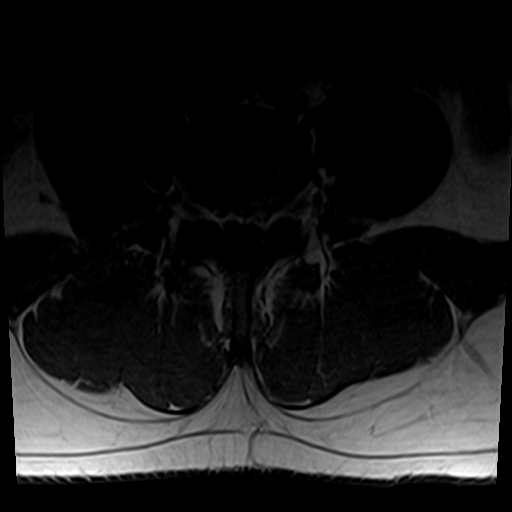
[im 21/48]
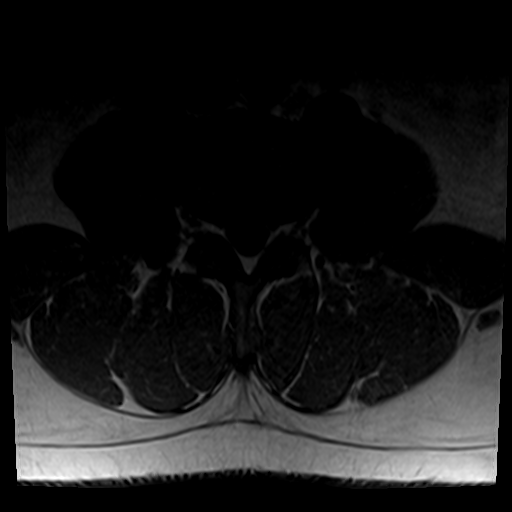
[im 24/48]
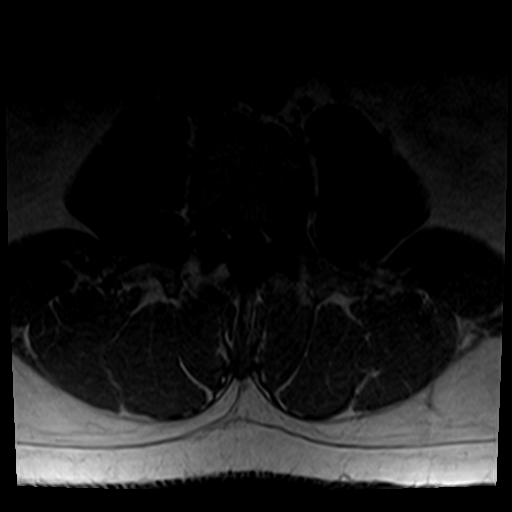
[im 42/48]
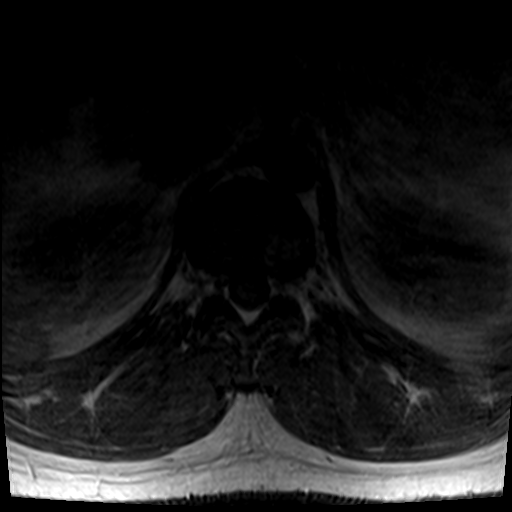

[27 of 48 positions shown; findings below may reference images not displayed]

FINDINGS: Segmentation:  5 lumbar type vertebral bodies.

Alignment:  9 mm anterolisthesis L5-S1.

Vertebrae:  Bilateral pars defects L5.

Conus medullaris and cauda equina: Conus extends to the L1 level.
Conus and cauda equina appear normal.

Paraspinal and other soft tissues: Normal

Disc levels:

No abnormality at L3-4 or above.

L4-5: Normal appearance of the disc. Mild facet and ligamentous
hypertrophy. No stenosis.

L5-S1: Chronic bilateral pars defects without much edema.
Anterolisthesis of 9 mm. Disc degeneration with annular fissures and
shallow protrusion. No central canal stenosis. Bilateral foraminal
stenosis that could compress either or both L5 nerves.
IMPRESSION: Chronic bilateral pars defects at L5 allowing 9 mm of
anterolisthesis. Shallow protrusion of the disc. Bilateral foraminal
stenosis that could compress either or both L5 nerves.

Mild facet hypertrophy at L4-5.  No stenosis.

## 2019-11-11 ENCOUNTER — Ambulatory Visit: Payer: Managed Care, Other (non HMO) | Admitting: Nurse Practitioner

## 2020-02-10 ENCOUNTER — Encounter: Payer: Managed Care, Other (non HMO) | Admitting: Nurse Practitioner

## 2020-04-01 ENCOUNTER — Other Ambulatory Visit: Payer: Self-pay

## 2020-04-01 ENCOUNTER — Encounter: Payer: Self-pay | Admitting: Nurse Practitioner

## 2020-04-01 ENCOUNTER — Ambulatory Visit (INDEPENDENT_AMBULATORY_CARE_PROVIDER_SITE_OTHER): Payer: Managed Care, Other (non HMO) | Admitting: Nurse Practitioner

## 2020-04-01 VITALS — BP 124/76 | HR 86 | Temp 98.1°F | Ht 69.0 in | Wt 235.8 lb

## 2020-04-01 DIAGNOSIS — M5442 Lumbago with sciatica, left side: Secondary | ICD-10-CM

## 2020-04-01 DIAGNOSIS — M48061 Spinal stenosis, lumbar region without neurogenic claudication: Secondary | ICD-10-CM | POA: Diagnosis not present

## 2020-04-01 DIAGNOSIS — M5441 Lumbago with sciatica, right side: Secondary | ICD-10-CM

## 2020-04-01 MED ORDER — GABAPENTIN 300 MG PO CAPS: 300.0000 mg | ORAL_CAPSULE | Freq: Three times a day (TID) | ORAL | 2 refills | Status: AC

## 2020-04-01 MED ORDER — KETOROLAC TROMETHAMINE 60 MG/2ML IM SOLN
60.0000 mg | Freq: Once | INTRAMUSCULAR | Status: AC
  Administered 2020-04-01: 60 mg via INTRAMUSCULAR

## 2020-04-01 NOTE — Progress Notes (Signed)
I,Yamilka Roman [employer] as a Neurosurgeon for [employer], FNP.,have documented all relevant documentation on the behalf of Arnette Felts, FNP,as directed by  Arnette Felts, FNP while in the presence of Arnette Felts, FNP.  This visit occurred during the SARS-CoV-2 public health emergency.  Safety protocols were in place, including screening questions prior to the visit, additional usage of staff PPE, and extensive cleaning of exam room while observing appropriate contact time as indicated for disinfecting solutions.  Subjective:     Patient ID: Miguel Lee , male    DOB: October 15, 1990 , 29 y.o.   MRN: [id number]   Chief Complaint  Patient presents with  . Back Pain    HPI  Back Pain This is a chronic (reoccurred this am causing him to not be able to get up this morning) problem. The current episode started today. The problem occurs constantly. The problem has been gradually worsening since onset. The pain is present in the sacro-iliac. Radiates to: radiates down both legs. The pain is at a severity of 8/10. The pain is severe. The pain is the same all the time. The symptoms are aggravated by standing, twisting and position. Associated symptoms include paresthesias and tingling. Pertinent negatives include no abdominal pain, headaches or numbness. Risk factors include sedentary lifestyle and lack of exercise.     Past Medical History:  Diagnosis Date  . Anxiety      History reviewed. No pertinent family history.   Current Outpatient Medications:  .  gabapentin (NEURONTIN) 300 MG capsule, Take 1 capsule (300 mg total) by mouth 3 (three) times daily., Disp: 90 capsule, Rfl: 2 .  PARoxetine (PAXIL) 30 MG tablet, Take 30 mg by mouth daily. (Patient not taking: Reported on 04/01/2020), Disp: , Rfl:  .  VITAMIN D PO, Take by mouth. Take one tablet weekly. (Patient not taking: Reported on 04/01/2020), Disp: , Rfl:    No Known Allergies   Review of Systems  Constitutional: Negative for fatigue.   Cardiovascular: Negative.   Gastrointestinal: Negative for abdominal pain.  Musculoskeletal: Positive for back pain.       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 Negative for numbness and headaches.  Psychiatric/Behavioral: Negative.      Today's Vitals   04/01/20 1109  BP: 124/76  Pulse: 86  Temp: 98.1 F (36.7 C)  Weight: 235 lb 12.8 oz (107 kg)  Height: 5\' 9"  (1.753 m)  PainSc: 8   PainLoc: Back   Body mass index is 34.82 kg/m.   Objective:  Physical Exam Constitutional:      General: He is not in acute distress.    Appearance: Normal appearance. He is obese.  Cardiovascular:     Rate and Rhythm: Normal rate and regular rhythm.     Pulses: Normal pulses.     Heart sounds: Normal heart sounds. No murmur heard.   Pulmonary:     Effort: Pulmonary effort is normal. No respiratory distress.     Breath sounds: Normal breath sounds.  Musculoskeletal:        General: No swelling, tenderness or deformity. Normal range of motion.     Comments: Negative for radiculopathy  Neurological:     General: No focal deficit present.     Mental Status: He is alert and oriented to person, place, and time.     Cranial Nerves: No cranial nerve deficit.  Psychiatric:        Mood and Affect: Mood normal.  Behavior: Behavior normal.        Thought Content: Thought content normal.        Judgment: Judgment normal.         Assessment And Plan:     1. Acute bilateral low back pain with bilateral sciatica  He is having reoccurring back pain from his history of spinal stenosis  Negative for radiculopathy - ketorolac (TORADOL) injection 60 mg - Ambulatory referral to Neurosurgery - gabapentin (NEURONTIN) 300 MG capsule; Take 1 capsule (300 mg total) by mouth 3 (three) times daily.  Dispense: 90 capsule; Refill: 2  2. Spinal stenosis of lumbar region without neurogenic claudication  Has  seen Dr. Joya San in the past and would like another referral to neurosurgery  Patient was given opportunity to ask questions. Patient verbalized understanding of the plan and was able to repeat key elements of the plan. All questions were answered to their satisfaction.    Jeanell Sparrow, FNP, have reviewed all documentation for this visit. The documentation on 04/01/20 for the exam, diagnosis, procedures, and orders are all accurate and complete.   THE PATIENT IS ENCOURAGED TO PRACTICE SOCIAL DISTANCING DUE TO THE COVID-19 PANDEMIC.

## 2020-04-02 ENCOUNTER — Encounter: Payer: Self-pay | Admitting: Nurse Practitioner

## 2020-04-03 ENCOUNTER — Encounter: Payer: Self-pay | Admitting: Nurse Practitioner

## 2020-06-11 ENCOUNTER — Encounter: Payer: Managed Care, Other (non HMO) | Admitting: Nurse Practitioner
